# Patient Record
Sex: Male | Born: 1980 | Race: Asian | Hispanic: No | Marital: Married | Smoking: Never smoker
Health system: Southern US, Community
[De-identification: ages and names within clinical notes are randomized; demographics above are authoritative.]

---

## 2014-09-13 ENCOUNTER — Ambulatory Visit
Admission: RE | Admit: 2014-09-13 | Discharge: 2014-09-13 | Disposition: A | Discharge status: Home / Self Care | Payer: BLUE CROSS/BLUE SHIELD | Source: Ambulatory Visit | Attending: Family Medicine | Admitting: Family Medicine

## 2014-09-13 ENCOUNTER — Other Ambulatory Visit: Payer: Self-pay | Admitting: Family Medicine

## 2014-09-13 DIAGNOSIS — M545 Low back pain: Secondary | ICD-10-CM

## 2014-10-11 ENCOUNTER — Other Ambulatory Visit: Payer: Self-pay | Admitting: Family Medicine

## 2014-10-11 DIAGNOSIS — M545 Low back pain: Secondary | ICD-10-CM

## 2014-10-20 ENCOUNTER — Other Ambulatory Visit: Payer: BLUE CROSS/BLUE SHIELD

## 2014-11-07 ENCOUNTER — Ambulatory Visit (INDEPENDENT_AMBULATORY_CARE_PROVIDER_SITE_OTHER): Payer: BLUE CROSS/BLUE SHIELD | Admitting: Internal Medicine

## 2014-11-07 ENCOUNTER — Ambulatory Visit (INDEPENDENT_AMBULATORY_CARE_PROVIDER_SITE_OTHER): Payer: BLUE CROSS/BLUE SHIELD

## 2014-11-07 VITALS — BP 118/80 | HR 67 | Temp 98.3°F | Resp 16 | Ht 67.0 in | Wt 176.4 lb

## 2014-11-07 DIAGNOSIS — M5416 Radiculopathy, lumbar region: Secondary | ICD-10-CM

## 2014-11-07 DIAGNOSIS — M5417 Radiculopathy, lumbosacral region: Secondary | ICD-10-CM

## 2014-11-07 MED ORDER — CYCLOBENZAPRINE HCL 10 MG PO TABS: 10.0000 mg | ORAL_TABLET | Freq: Every day | ORAL | Status: DC

## 2014-11-07 MED ORDER — PREDNISONE 20 MG PO TABS: ORAL_TABLET | ORAL | Status: DC

## 2014-11-07 MED ORDER — DICLOFENAC SODIUM 75 MG PO TBEC: 75.0000 mg | DELAYED_RELEASE_TABLET | Freq: Two times a day (BID) | ORAL | Status: DC

## 2014-11-07 NOTE — Progress Notes (Signed)
Subjective:  This chart was scribed for Peter Pearsonobert P Dorothie Wah, MD by Charline BillsEssence Howell, ED Scribe. The patient was seen in room 7. Patient's care was started at 10:22 AM.   Patient ID: Peter Ward, male    DOB: 12/03/1980, 34 y.o.   MRN: [id number]  Chief Complaint  Patient presents with  . Back Pain    lower back pain with pain radiating to his left leg.  hard to lift left leg at times   HPI HPI Comments: Peter Ward is a 34 y.o. male who presents to the [facility] complaining of persistent lower back pain that radiates into L leg for the past 2 months. He reports that he was lifting a heavy object and twisting at work 2 months ago when he felt a sudden sharp pain in his back. Pain is exacerbated with lifting his L leg and his leg actually feels weak as he tries to step up. He denies increased pain with bending or rolling over at this time, but does get were with lifting at work on a daily basis. No past history of back injury Healthy otherwise  History reviewed. No pertinent past medical history. No current outpatient prescriptions on file prior to visit.   No current facility-administered medications on file prior to visit.   No Known Allergies  Med history in the computer shows treatment in February with meloxicam and low-dose prednisone He says this was done in an equal facility but as I search for records under care everywhere the [facility] response did not come and allow me access--x-rays reportedly were normal He did not respond at all to medication and has continued to have disability at work  Review of Systems  Musculoskeletal: Positive for back pain.   no constipation or diarrhea No stool incontinence No dysuria or frequency or urine incontinence Sleep is not disrupted by this pain    Objective:   Physical Exam  Constitutional: He is oriented to person, place, and time. He appears well-developed and well-nourished.  HENT:  Head: Normocephalic and atraumatic.    Cardiovascular: Normal rate.   Pulmonary/Chest: Effort normal.  Musculoskeletal:  Tender over the L SI area. Straight leg raise positive at 45 degrees on the L.   Neurological: He is alert and oriented to person, place, and time. He has normal strength and normal reflexes. No sensory deficit.  Skin: Skin is warm and dry.  Psychiatric: He has a normal mood and affect. His behavior is normal.  BP 118/80 mmHg  Pulse 67  Temp(Src) 98.3 F (36.8 C) (Oral)  Resp 16  Ht 5\' 7"  (1.702 m)  Wt 176 lb 6 oz (80.003 kg)  BMI 27.62 kg/m2  SpO2 99%  [facility] reading (PRIMARY) by  Dr. Merla Richesoolittle= there is some straightening of the spine but no obvious bony lesions or narrowing of disc spaces      Assessment & Plan:  Lumbar back pain with radiculopathy affecting left lower extremity - Plan: Work limitations 10-15 pounds, Ambulatory referral to Physical Therapy home stretch-legs up the wall Meds ordered this encounter  Medications  . predniSONE (DELTASONE) 20 MG tablet    Sig: 3/3/2/2/1/1 single daily dose for 6 days    Dispense:  12 tablet    Refill:  0  . cyclobenzaprine (FLEXERIL) 10 MG tablet    Sig: Take 1 tablet (10 mg total) by mouth at bedtime.    Dispense:  30 tablet    Refill:  0  . diclofenac (VOLTAREN) 75  MG EC tablet    Sig: Take 1 tablet (75 mg total) by mouth 2 (two) times daily.    Dispense:  60 tablet    Refill:  0   Follow-up one month-if he fails treatment we will MRI  I have completed the patient encounter in its entirety as documented by the scribe, with editing by me where necessary. Darryl Willner P. Merla Riches, M.D.

## 2014-11-13 ENCOUNTER — Telehealth: Payer: Self-pay

## 2014-11-13 NOTE — Telephone Encounter (Signed)
Pt states he would like to have a copy of his last visit so he can give to his employer. Please call [phone] when ready for pick up

## 2014-11-13 NOTE — Telephone Encounter (Signed)
Printed out the last office visit and put in pick up draw at 102 with ROI attached. Called patient and let him know it was ready for pick up and that he would need to fill out the ROI that would be attached to it, he stated that would be fine but that he would not be able to pick it up until Friday. Will check on ROI Friday.

## 2014-11-20 ENCOUNTER — Other Ambulatory Visit: Payer: Self-pay | Admitting: Radiology

## 2014-12-08 ENCOUNTER — Ambulatory Visit (INDEPENDENT_AMBULATORY_CARE_PROVIDER_SITE_OTHER): Payer: BLUE CROSS/BLUE SHIELD | Admitting: Internal Medicine

## 2014-12-08 VITALS — BP 120/66 | HR 89 | Temp 98.3°F | Resp 20 | Ht 67.5 in | Wt 180.5 lb

## 2014-12-08 DIAGNOSIS — M5416 Radiculopathy, lumbar region: Secondary | ICD-10-CM

## 2014-12-08 DIAGNOSIS — M5417 Radiculopathy, lumbosacral region: Secondary | ICD-10-CM

## 2014-12-08 NOTE — Progress Notes (Signed)
   Subjective:    Patient ID: Peter Ward, male    DOB: 02/17/1981, 34 y.o.   MRN: [id number] This chart was scribed for Ellamae Siaobert Trenae Brunke, MD by Littie Deedsichard Sun, Medical Scribe. This patient was seen in Room 1 and the patient's care was started at 1:31 PM.   HPI HPI Comments: Peter Ward is a 34 y.o. male who presents to the [facility] for a follow-up. Patient notes his back pain has improved significantly. He has been put on light duty, but states he feels he can return to work full duty and requests a note allowing him to return full duty.  He does report having a stabbing pain to his left foot occasionally. He does not think this is related to the back pain.  Review of Systems Noncontributory    Objective:   Physical Exam  Constitutional: He is oriented to person, place, and time. He appears well-developed and well-nourished. No distress.  HENT:  Head: Normocephalic and atraumatic.  Mouth/Throat: Oropharynx is clear and moist. No oropharyngeal exudate.  Eyes: Pupils are equal, round, and reactive to light.  Neck: Neck supple.  Cardiovascular: Normal rate.   Pulmonary/Chest: Effort normal.  Musculoskeletal: He exhibits no edema.  Lumbar range of motion motion full Straight leg raise negative to 90 bilaterally Deep tendon reflexes symmetrical Gait normal There is no tenderness to palpation in the left foot which also has a good range of motion without precipitating discomfort  Neurological: He is alert and oriented to person, place, and time. No cranial nerve deficit.  Skin: Skin is warm and dry. No rash noted.  Psychiatric: He has a normal mood and affect. His behavior is normal.  Vitals reviewed.         Assessment & Plan:   Lumbar back pain with radiculopathy affecting left lower extremity resolved  I have completed the patient encounter in its entirety as documented by the scribe, with editing by me where necessary. Vikas Wegmann P. Merla Richesoolittle, M.D.

## 2016-09-19 DIAGNOSIS — E785 Hyperlipidemia, unspecified: Secondary | ICD-10-CM | POA: Diagnosis not present

## 2016-09-19 DIAGNOSIS — K640 First degree hemorrhoids: Secondary | ICD-10-CM | POA: Diagnosis not present

## 2016-09-19 DIAGNOSIS — Z0000 Encounter for general adult medical examination without abnormal findings: Secondary | ICD-10-CM | POA: Diagnosis not present

## 2016-09-19 DIAGNOSIS — J029 Acute pharyngitis, unspecified: Secondary | ICD-10-CM | POA: Diagnosis not present

## 2016-11-28 DIAGNOSIS — H8113 Benign paroxysmal vertigo, bilateral: Secondary | ICD-10-CM | POA: Diagnosis not present

## 2016-11-28 DIAGNOSIS — R42 Dizziness and giddiness: Secondary | ICD-10-CM | POA: Diagnosis not present

## 2018-02-28 ENCOUNTER — Emergency Department (HOSPITAL_COMMUNITY): Payer: BLUE CROSS/BLUE SHIELD

## 2018-02-28 ENCOUNTER — Encounter (HOSPITAL_COMMUNITY): Payer: Self-pay | Admitting: Emergency Medicine

## 2018-02-28 ENCOUNTER — Other Ambulatory Visit: Payer: Self-pay

## 2018-02-28 ENCOUNTER — Emergency Department (HOSPITAL_COMMUNITY)
Admission: EM | Admit: 2018-02-28 | Discharge: 2018-02-28 | Disposition: A | Discharge status: Home / Self Care | Payer: BLUE CROSS/BLUE SHIELD | Attending: Emergency Medicine | Admitting: Emergency Medicine

## 2018-02-28 DIAGNOSIS — M25512 Pain in left shoulder: Secondary | ICD-10-CM

## 2018-02-28 DIAGNOSIS — Z79899 Other long term (current) drug therapy: Secondary | ICD-10-CM | POA: Diagnosis not present

## 2018-02-28 DIAGNOSIS — R0789 Other chest pain: Secondary | ICD-10-CM | POA: Insufficient documentation

## 2018-02-28 DIAGNOSIS — R079 Chest pain, unspecified: Secondary | ICD-10-CM | POA: Diagnosis not present

## 2018-02-28 LAB — CBC
HCT: 43.1 % (ref 39.0–52.0)
Hemoglobin: 14.2 g/dL (ref 13.0–17.0)
MCH: 31.1 pg (ref 26.0–34.0)
MCHC: 32.9 g/dL (ref 30.0–36.0)
MCV: 94.3 fL (ref 78.0–100.0)
PLATELETS: 217 10*3/uL (ref 150–400)
RBC: 4.57 MIL/uL (ref 4.22–5.81)
RDW: 11.8 % (ref 11.5–15.5)
WBC: 7 10*3/uL (ref 4.0–10.5)

## 2018-02-28 LAB — BASIC METABOLIC PANEL
Anion gap: 11 (ref 5–15)
BUN: 14 mg/dL (ref 6–20)
CO2: 24 mmol/L (ref 22–32)
CREATININE: 1.09 mg/dL (ref 0.61–1.24)
Calcium: 9.6 mg/dL (ref 8.9–10.3)
Chloride: 104 mmol/L (ref 98–111)
GFR calc Af Amer: >60 mL/min (ref >60)
GFR calc non Af Amer: >60 mL/min (ref >60)
Glucose, Bld: 111 mg/dL — ABNORMAL HIGH (ref 70–99)
Potassium: 3.8 mmol/L (ref 3.5–5.1)
Sodium: 139 mmol/L (ref 135–145)

## 2018-02-28 LAB — I-STAT TROPONIN, ED: TROPONIN I, POC: 0 ng/mL (ref 0.00–0.08)

## 2018-02-28 MED ORDER — METHOCARBAMOL 500 MG PO TABS
500.0000 mg | ORAL_TABLET | Freq: Two times a day (BID) | ORAL | 0 refills | Status: DC
Start: 2018-02-28 — End: 2023-06-21

## 2018-02-28 NOTE — ED Provider Notes (Signed)
[facility] EMERGENCY DEPARTMENT Provider Note   CSN: [id number] Arrival date & time: 02/28/18  0118     History   Chief Complaint Chief Complaint  Patient presents with  . Chest Pain    HPI Peter Ward is a 37 y.o. male.  HPI   Peter Ward is a 37 y.o. male, patient with no pertinent past medical history, presenting to the ED with left-sided chest pain began a few days ago.  Pain is intermittent, arises with movement of the left arm or twisting of the body, last for about a minute, sharp, 6/10, accompanied by tingling in the left arm and some pain in the left shoulder.  Has not tried any therapies for this issue.  Denies fever/chills, cough, shortness of breath, N/V/D, numbness, weakness, trauma, abdominal pain, lower extremity edema or pain, or any other complaints.      History reviewed. No pertinent past medical history.  There are no active problems to display for this patient.   History reviewed. No pertinent surgical history.      Home Medications    Prior to Admission medications   Medication Sig Start Date End Date Taking? Authorizing Provider  Omega-3 Fatty Acids (FISH OIL PO) Take 1 capsule by mouth daily.   Yes [provider]  cyclobenzaprine (FLEXERIL) 10 MG tablet Take 1 tablet (10 mg total) by mouth at bedtime. Patient not taking: Reported on 02/28/2018 11/07/14   Peter Pearson, MD  diclofenac (VOLTAREN) 75 MG EC tablet Take 1 tablet (75 mg total) by mouth 2 (two) times daily. Patient not taking: Reported on 02/28/2018 11/07/14   Peter Pearson, MD  methocarbamol (ROBAXIN) 500 MG tablet Take 1 tablet (500 mg total) by mouth 2 (two) times daily. 02/28/18   Peter Ward, Peter Danker, PA-C  predniSONE (DELTASONE) 20 MG tablet 3/3/2/2/1/1 single daily dose for 6 days Patient not taking: Reported on 12/08/2014 11/07/14   Peter Pearson, MD    Family History Family History  Problem Relation Age of Onset  . Diabetes Mother     Social  History Social History   Tobacco Use  . Smoking status: Never Smoker  . Smokeless tobacco: Never Used  Substance Use Topics  . Alcohol use: No    Alcohol/week: 0.0 oz  . Drug use: No     Allergies   Patient has no known allergies.   Review of Systems Review of Systems  Constitutional: Negative for chills, diaphoresis and fever.  Respiratory: Negative for cough and shortness of breath.   Cardiovascular: Positive for chest pain. Negative for leg swelling.  Gastrointestinal: Negative for abdominal pain, diarrhea, nausea and vomiting.  Musculoskeletal: Positive for arthralgias. Negative for back pain and neck pain.  Neurological: Negative for weakness and numbness.  All other systems reviewed and are negative.    Physical Exam Updated Vital Signs BP (!) 145/94 (BP Location: Right Arm)   Pulse (!) 55   Temp 97.8 F (36.6 C) (Oral)   Resp 18   Ht 5\' 7"  (1.702 m)   Wt 83.5 kg (184 lb)   SpO2 99%   BMI 28.82 kg/m   Physical Exam  Constitutional: He appears well-developed and well-nourished. No distress.  HENT:  Head: Normocephalic and atraumatic.  Eyes: Conjunctivae are normal.  Neck: Neck supple.  Cardiovascular: Normal rate, regular rhythm, normal heart sounds and intact distal pulses.  Pulmonary/Chest: Effort normal and breath sounds normal. No respiratory distress.    Abdominal: Soft. There is no tenderness. There  is no guarding.  Musculoskeletal: He exhibits no edema.       Arms: Full range of motion in the left shoulder without hesitation or pain.  No noted swelling, erythema, bruising, instability, deformity, or crepitus in the left shoulder or the chest.  Lymphadenopathy:    He has no cervical adenopathy.  Neurological: He is alert.  Sensation grossly intact to light touch through each of the nerve distributions of the bilateral upper extremities. Abduction and adduction of the fingers intact against resistance. Grip strength equal bilaterally. Supination  and pronation intact against resistance. Strength 5/5 through the cardinal directions of the bilateral wrists. Strength 5/5 with flexion and extension of the bilateral elbows. Strength 5/5 in the cardinal directions of the bilateral shoulders. Patient can touch the thumb to each one of the fingertips without difficulty.   Skin: Skin is warm and dry. He is not diaphoretic.  Psychiatric: He has a normal mood and affect. His behavior is normal.  Nursing note and vitals reviewed.    ED Treatments / Results  Labs (all labs ordered are listed, but only abnormal results are displayed) Labs Reviewed  BASIC METABOLIC PANEL - Abnormal; Notable for the following components:      Result Value   Glucose, Bld 111 (*)    All other components within normal limits  CBC  I-STAT TROPONIN, ED    EKG EKG Interpretation  Date/Time:  Sunday February 28 2018 01:21:59 EDT Ventricular Rate:  56 PR Interval:  182 QRS Duration: 104 QT Interval:  416 QTC Calculation: 401 R Axis:   80 Text Interpretation:  Sinus bradycardia Otherwise normal ECG No old tracing to compare Confirmed by Jacalyn Lefevre [phone]) on 02/28/2018 7:11:30 AM   Radiology Dg Chest 2 View  Result Date: 02/28/2018 CLINICAL DATA:  Left-sided chest pain. EXAM: CHEST - 2 VIEW COMPARISON:  None. FINDINGS: The cardiomediastinal contours are normal. The lungs are clear. Pulmonary vasculature is normal. No consolidation, pleural effusion, or pneumothorax. No acute osseous abnormalities are seen. IMPRESSION: Normal radiographs of the chest. Electronically Signed   By: Peter Ward M.D.   On: 02/28/2018 02:12    Procedures Procedures (including critical care time)  Medications Ordered in ED Medications - No data to display   Initial Impression / Assessment and Plan / ED Course  I have reviewed the triage vital signs and the nursing notes.  Pertinent labs & imaging results that were available during my care of the patient were reviewed  by me and considered in my medical decision making (see chart for details).     Patient presents with complaint of intermittent chest pain along with tingling in the left arm. Low suspicion for ACS. HEART score is 1, indicating low risk for a cardiac event. Wells criteria score is 0, indicating low risk for PE. PERC negative.  Labs, EKG, and chest x-ray results reassuring.  PCP vs orthopedic follow-up. The patient was given instructions for home care as well as return precautions. Patient voices understanding of these instructions, accepts the plan, and is comfortable with discharge.  Vitals:   02/28/18 0120 02/28/18 0400  BP: (!) 144/99 (!) 145/94  Pulse: (!) 52 (!) 55  Resp: 16 18  Temp: 97.8 F (36.6 C)   TempSrc: Oral   SpO2: 96% 99%  Weight: 83.5 kg (184 lb)   Height: 5\' 7"  (1.702 m)      Final Clinical Impressions(s) / ED Diagnoses   Final diagnoses:  Atypical chest pain  Acute pain of left  shoulder    ED Discharge Orders        Ordered    methocarbamol (ROBAXIN) 500 MG tablet  2 times daily     02/28/18 0721       Anselm PancoastJoy, Gregoire Bennis C, PA-C 02/28/18 [id number]    Jacalyn LefevreHaviland, Julie, MD 02/28/18 [phone]

## 2018-02-28 NOTE — ED Notes (Signed)
Pt discharged from ED; instructions provided; Pt encouraged to return to ED if symptoms worsen and to f/u with PCP; Pt verbalized understanding of all instructions 

## 2018-02-28 NOTE — ED Triage Notes (Signed)
Pt states for a few weeks has had left sided chest pain that occasionally moves to his shoulder. No n/v or diaphoresis. Has had increase in burping. Worse with movement.

## 2018-02-28 NOTE — Discharge Instructions (Signed)
Your labs, imaging, and EKG results were reassuring.   Antiinflammatory medications: Take 600 mg of ibuprofen every 6 hours or 440 mg (over the counter dose) to 500 mg (prescription dose) of naproxen every 12 hours for the next 3 days. After this time, these medications may be used as needed for pain. Take these medications with food to avoid upset stomach. Choose only one of these medications, do not take them together.  Tylenol: Should you continue to have additional pain while taking the ibuprofen or naproxen, you may add in tylenol as needed. Your daily total maximum amount of tylenol from all sources should be limited to 4000mg /day for persons without liver problems, or 2000mg /day for those with liver problems. Muscle relaxer: Robaxin is a muscle relaxer and may help loosen stiff muscles. Do not take the Robaxin while driving or performing other dangerous activities.  Exercises: Be sure to perform the attached exercises starting with three times a week and working up to performing them daily. This is an essential part of preventing long term problems.  Follow up: Follow up with a primary care provider for any future management of these complaints. Be sure to follow up within 7-10 days.  May also follow-up with the orthopedic specialist on this matter. Return: Return to the ED should symptoms worsen or you have persistent shortness of breath, dizziness, sweating, extremity weakness, or any other major concerns.

## 2018-03-05 DIAGNOSIS — R0789 Other chest pain: Secondary | ICD-10-CM | POA: Diagnosis not present

## 2018-03-05 DIAGNOSIS — H00011 Hordeolum externum right upper eyelid: Secondary | ICD-10-CM | POA: Diagnosis not present

## 2018-04-06 DIAGNOSIS — Z0000 Encounter for general adult medical examination without abnormal findings: Secondary | ICD-10-CM | POA: Diagnosis not present

## 2018-04-06 DIAGNOSIS — K640 First degree hemorrhoids: Secondary | ICD-10-CM | POA: Diagnosis not present

## 2018-04-06 DIAGNOSIS — E785 Hyperlipidemia, unspecified: Secondary | ICD-10-CM | POA: Diagnosis not present

## 2019-09-29 ENCOUNTER — Other Ambulatory Visit: Payer: Self-pay | Admitting: Gastroenterology

## 2019-09-29 DIAGNOSIS — R109 Unspecified abdominal pain: Secondary | ICD-10-CM

## 2019-10-05 ENCOUNTER — Other Ambulatory Visit: Payer: Self-pay | Admitting: Gastroenterology

## 2019-10-05 DIAGNOSIS — R1013 Epigastric pain: Secondary | ICD-10-CM

## 2019-10-06 ENCOUNTER — Ambulatory Visit
Admission: RE | Admit: 2019-10-06 | Discharge: 2019-10-06 | Disposition: A | Discharge status: Home / Self Care | Payer: BLUE CROSS/BLUE SHIELD | Source: Ambulatory Visit | Attending: Gastroenterology | Admitting: Gastroenterology

## 2019-10-06 DIAGNOSIS — R1013 Epigastric pain: Secondary | ICD-10-CM

## 2020-05-30 IMAGING — CR DG CHEST 2V
2 series · 2 of 2 positions shown · non-contrast
Comparison: None.

CLINICAL DATA: Left-sided chest pain.

EXAM:
CHEST - 2 VIEW

[chest pa]
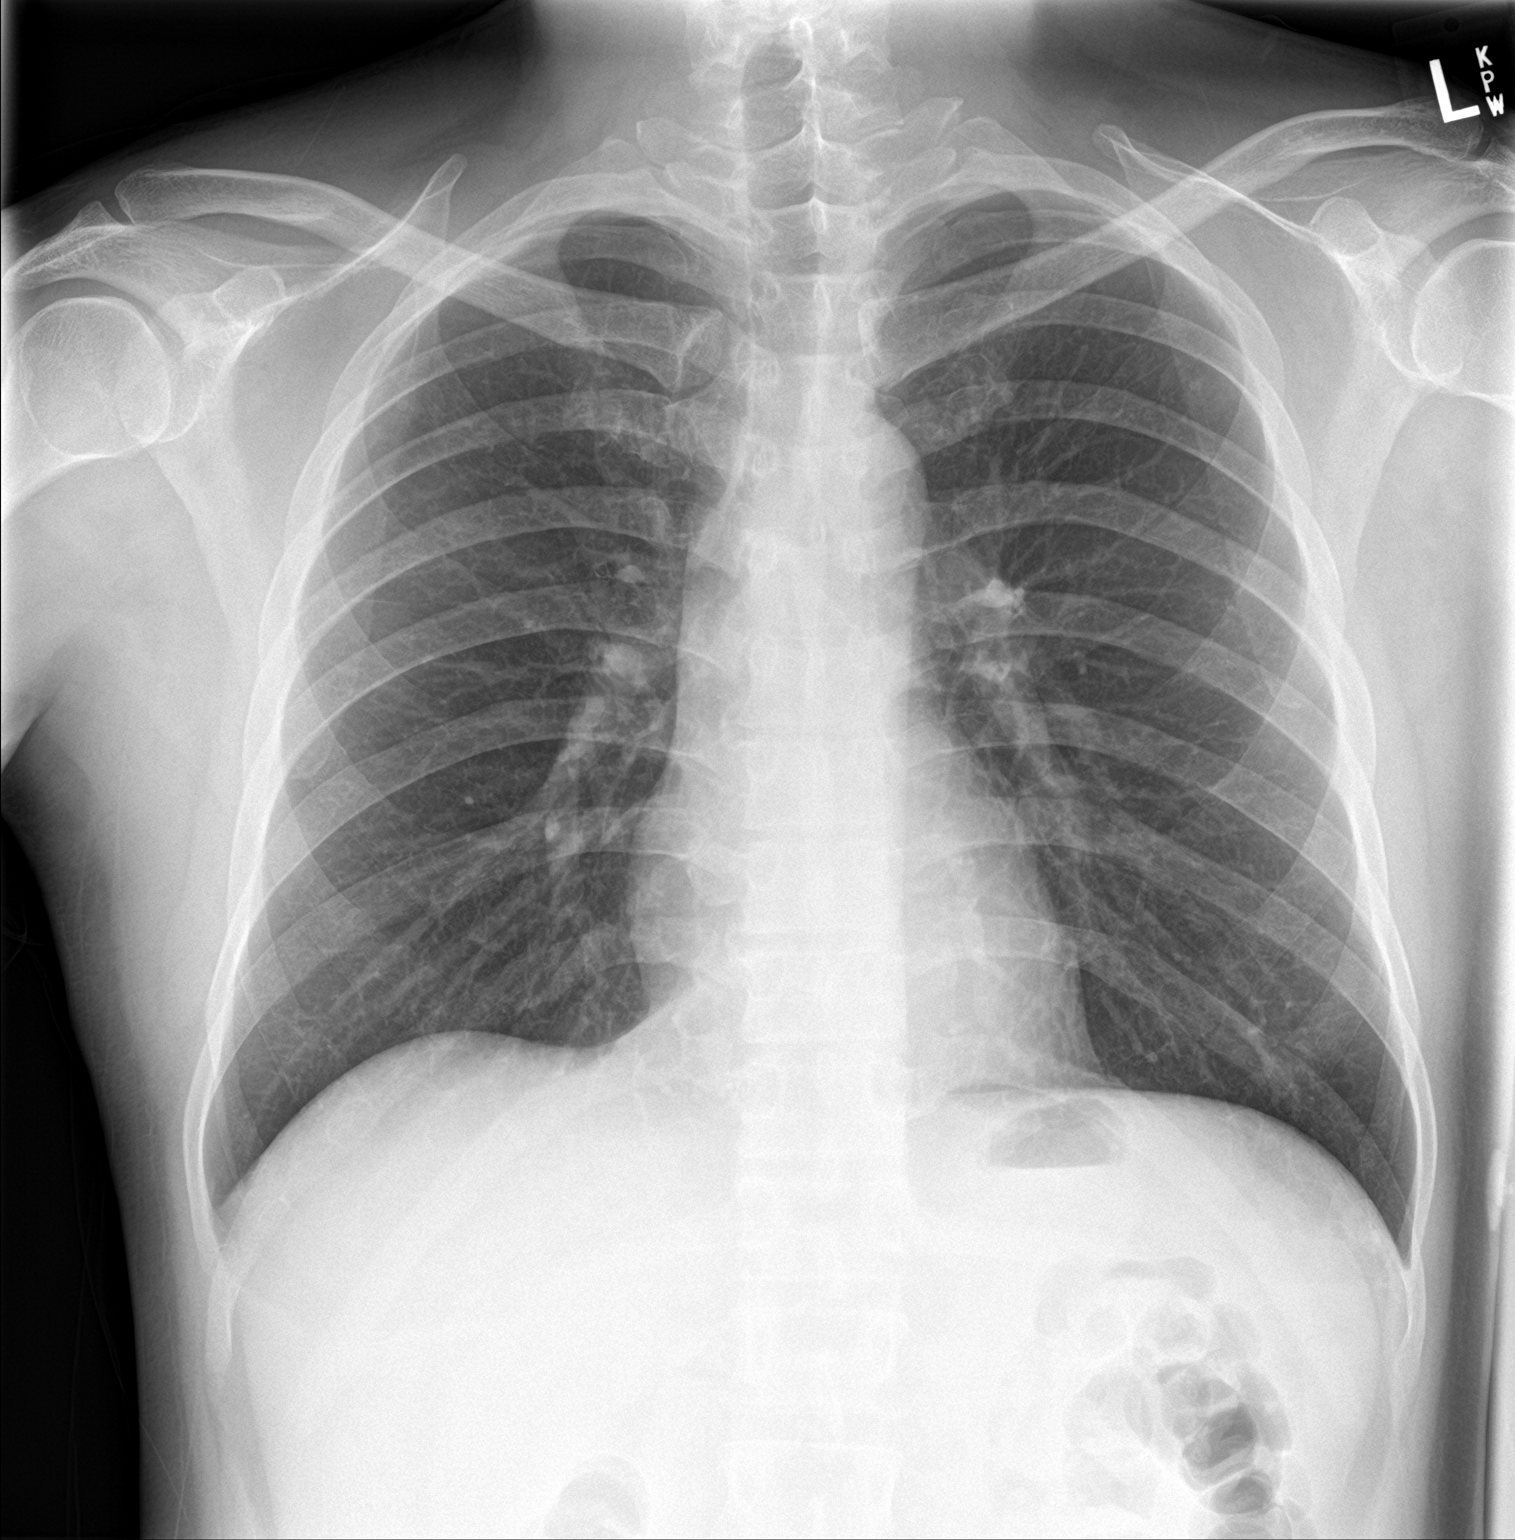

[chest lat]
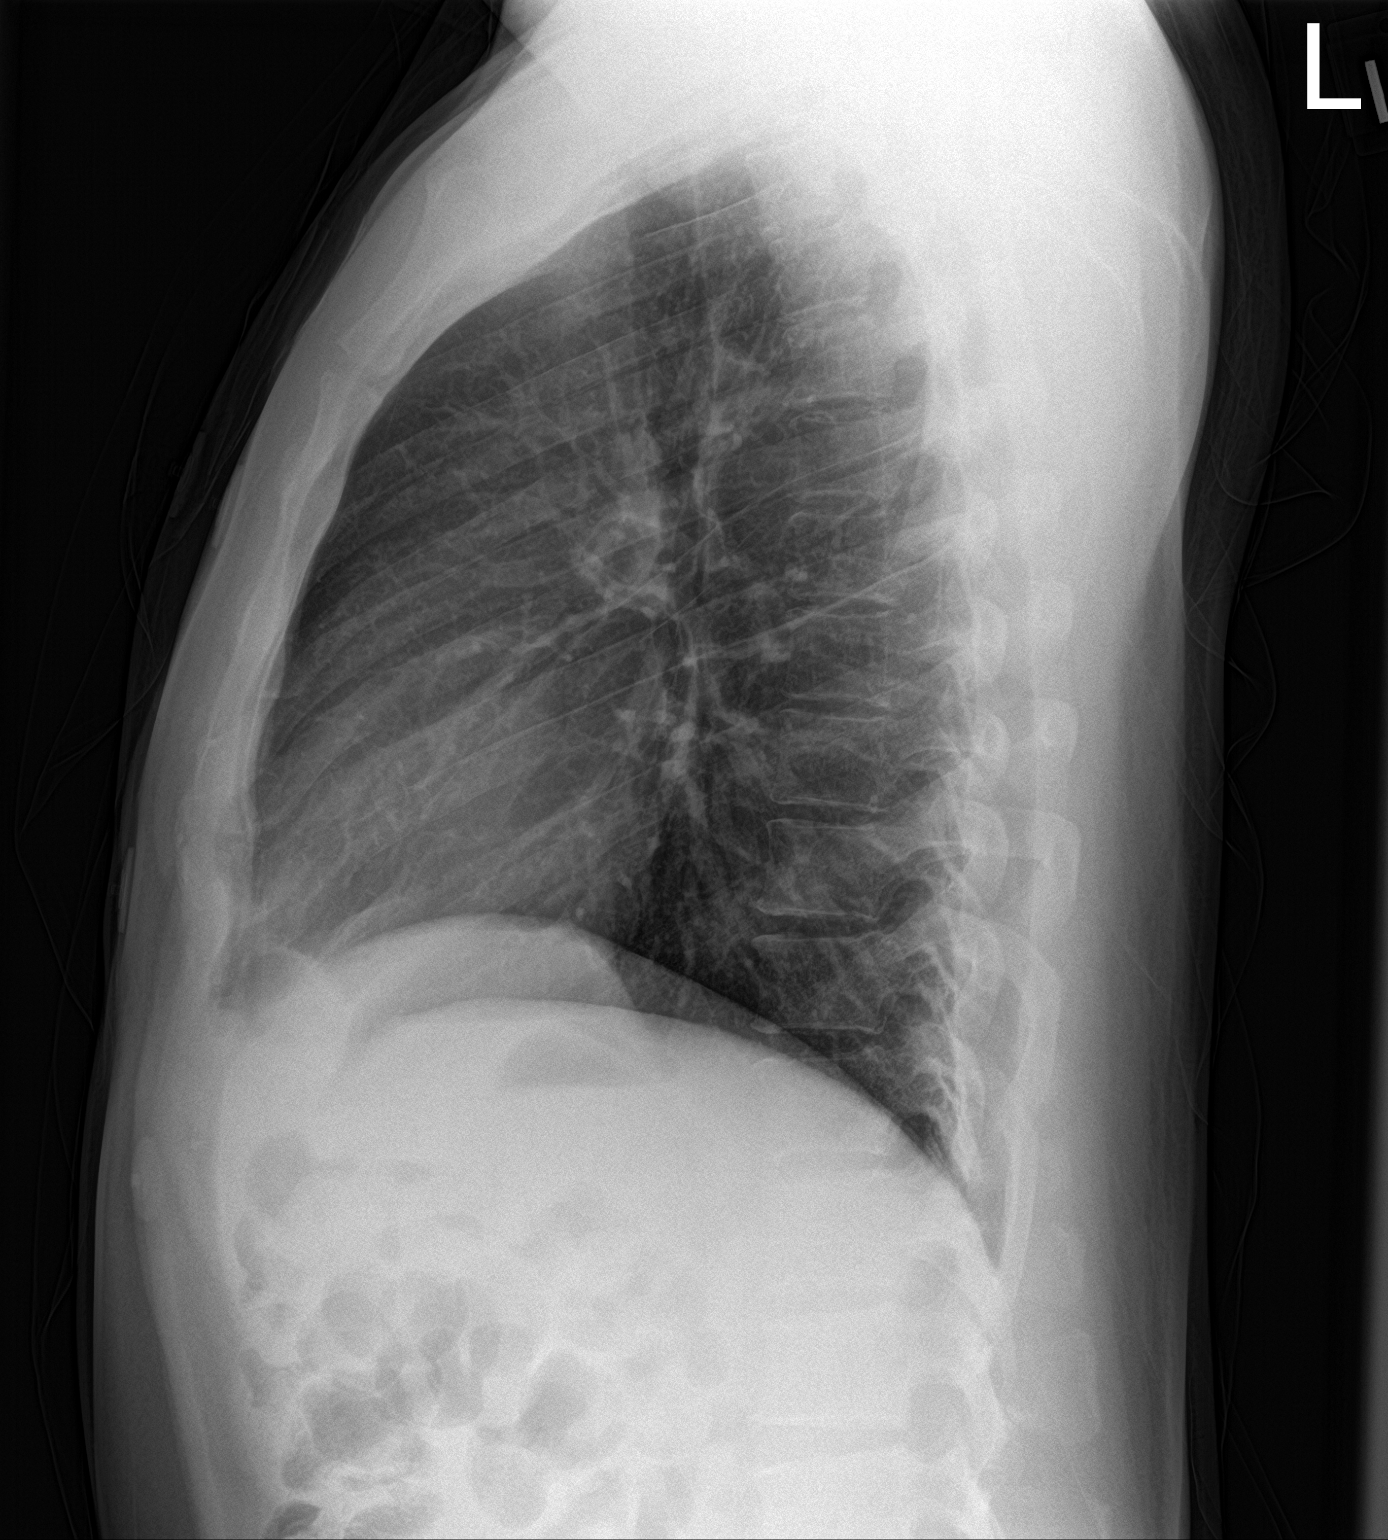

[2 of 2 positions shown; findings below may reference images not displayed]

FINDINGS: The cardiomediastinal contours are normal. The lungs are clear.
Pulmonary vasculature is normal. No consolidation, pleural effusion,
or pneumothorax. No acute osseous abnormalities are seen.
IMPRESSION: Normal radiographs of the chest.

## 2022-01-05 IMAGING — US US ABDOMEN COMPLETE
1 series · 14 of 25 positions shown · non-contrast
Comparison: None.

CLINICAL DATA: Epigastric burning sensation

EXAM:
ABDOMEN ULTRASOUND COMPLETE

[Series 1: us abdomen complete · 0.14mm/px · 14 of 179 slices shown]
[im 1/179]
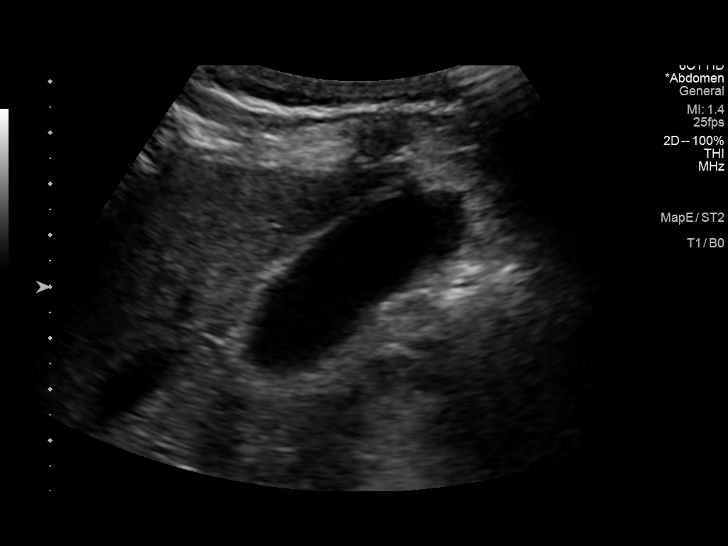
[im 15/179]
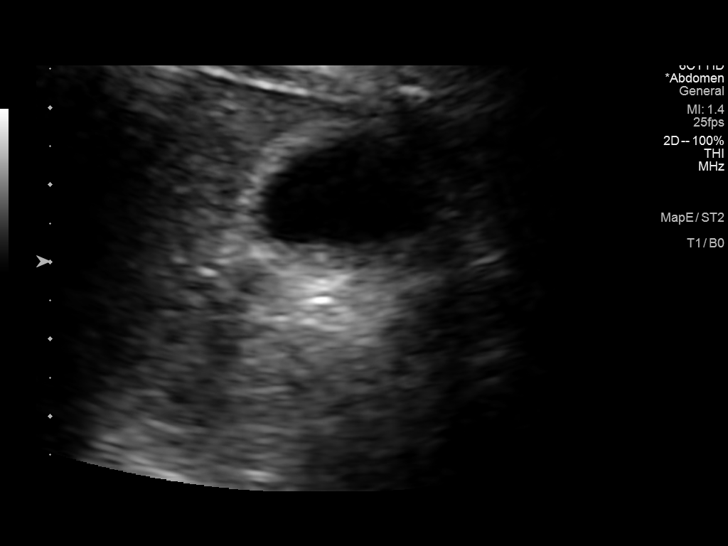
[im 30/179]
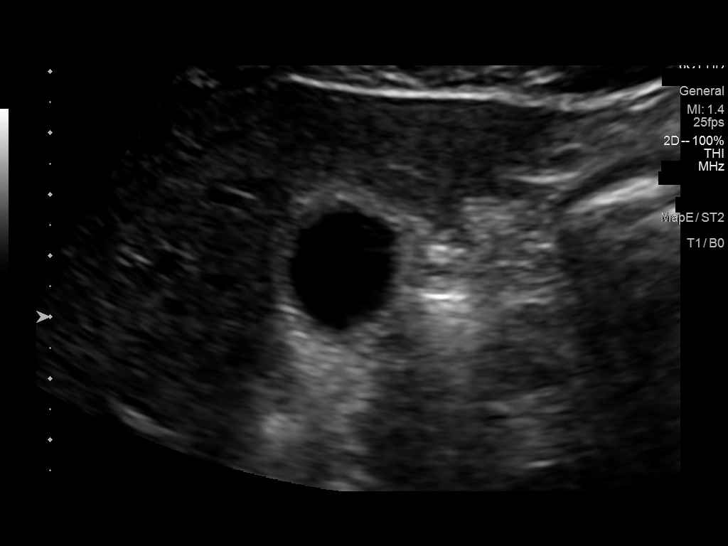
[im 45/179]
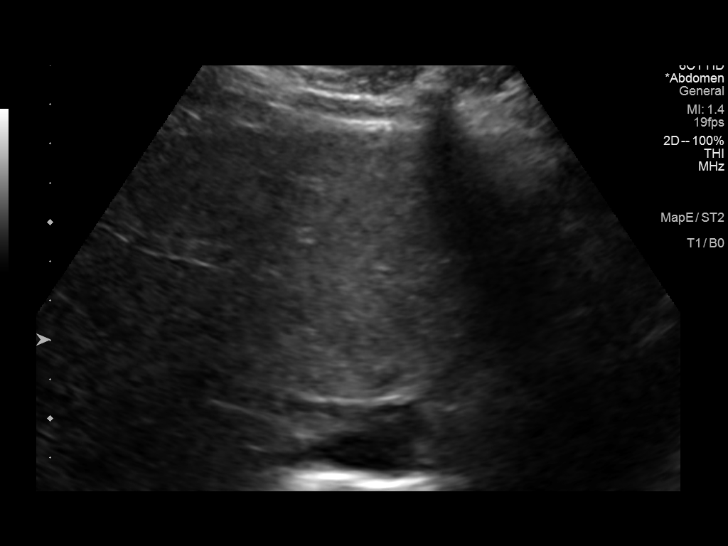
[im 60/179]
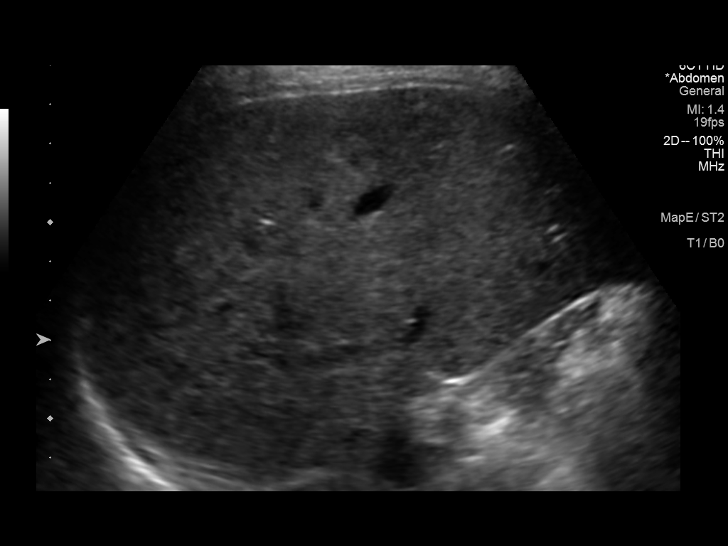
[im 67/179]
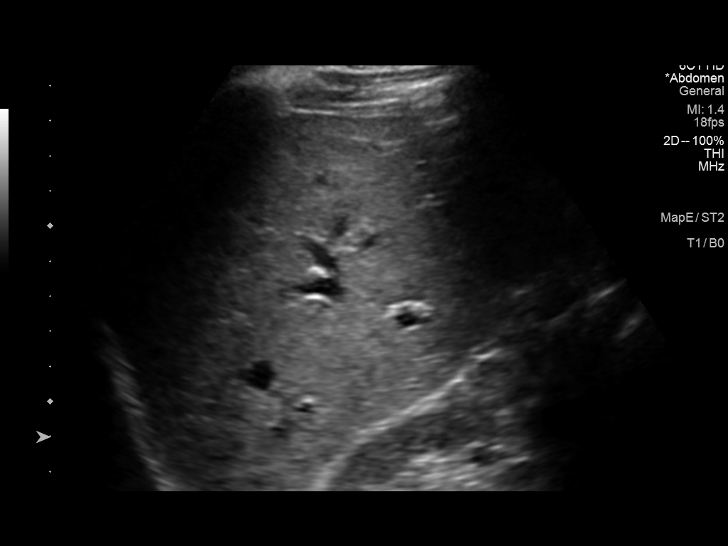
[im 82/179]
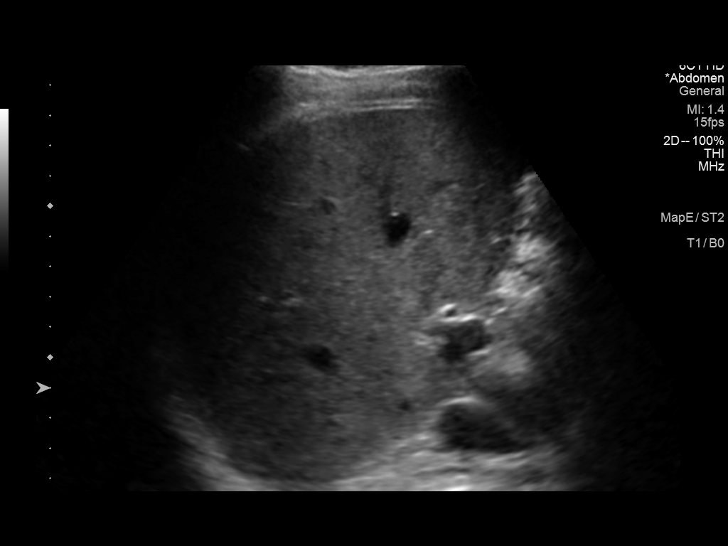
[im 97/179]
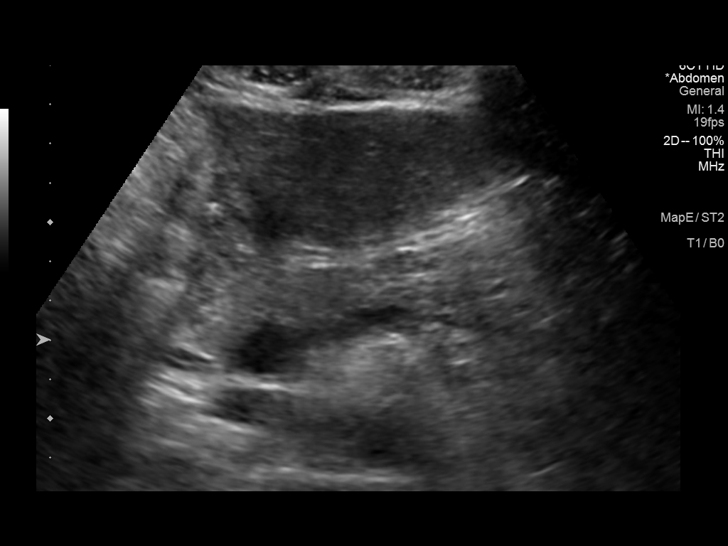
[im 112/179]
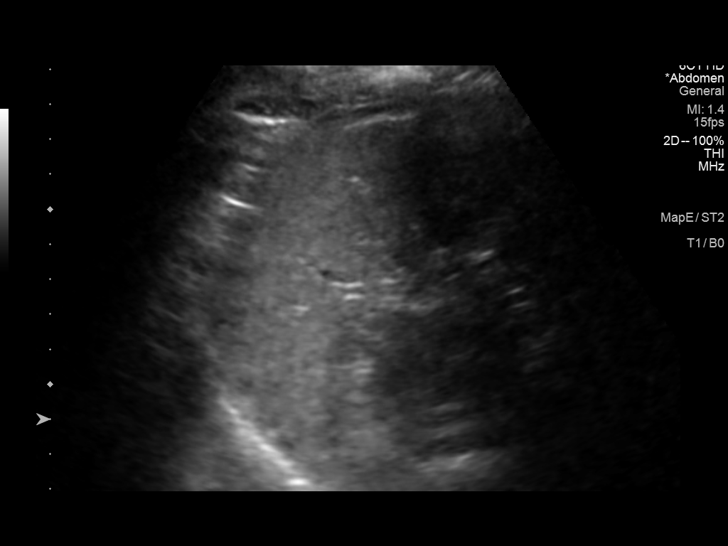
[im 119/179]
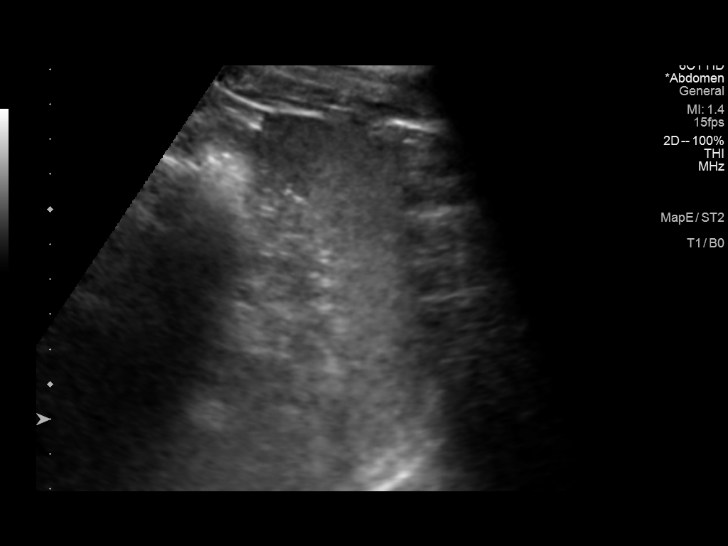
[im 134/179]
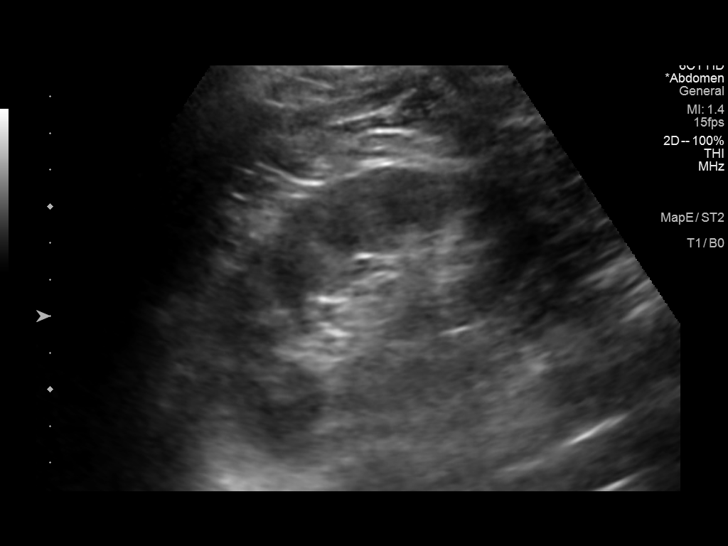
[im 149/179]
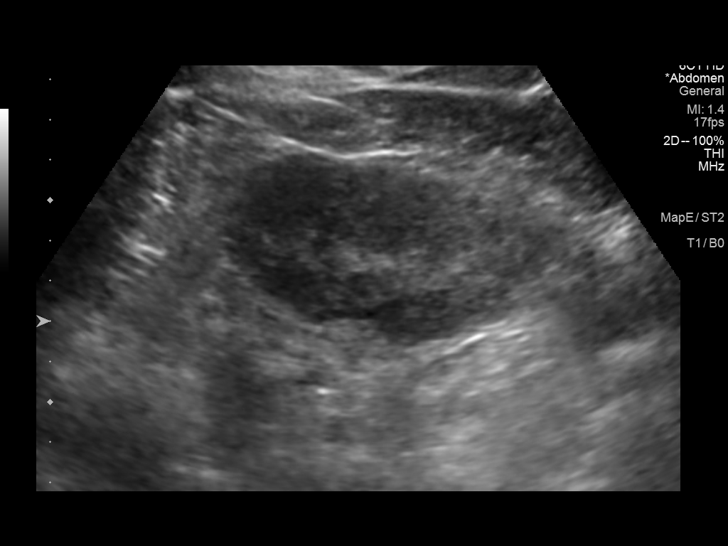
[im 164/179]
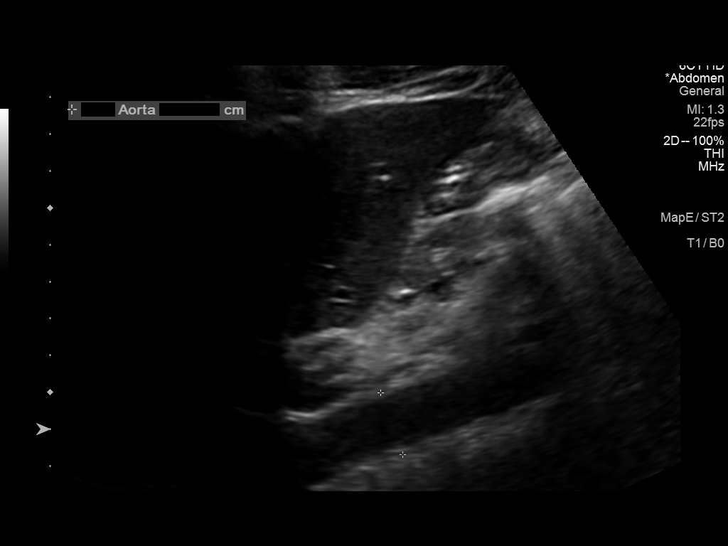
[im 179/179]
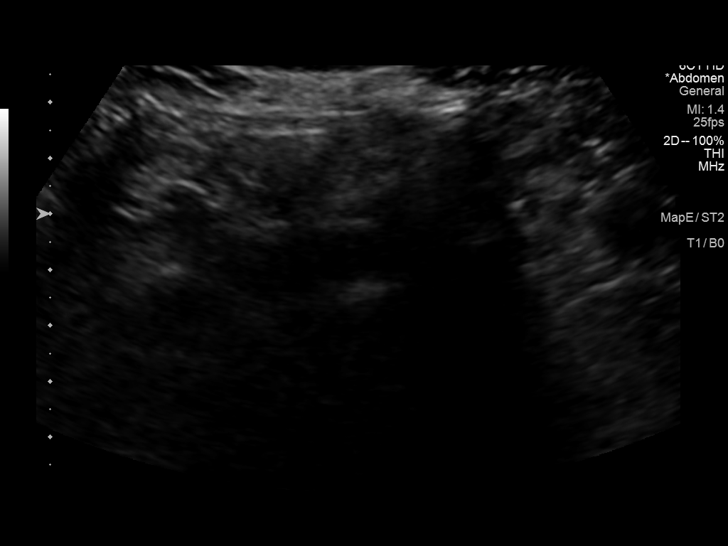

[14 of 25 positions shown; findings below may reference images not displayed]

FINDINGS: Gallbladder: No gallstones or wall thickening visualized. No
sonographic Murphy sign noted by sonographer.

Common bile duct: Diameter: 2 mm

Liver: No focal lesion identified. Within normal limits in
parenchymal echogenicity. Portal vein is patent on color Doppler
imaging with normal direction of blood flow towards the liver.

IVC: No abnormality visualized.

Pancreas: Visualized portion unremarkable.

Spleen: Size and appearance within normal limits.

Right Kidney: Length: 10.2 cm. Echogenicity within normal limits. No
mass or hydronephrosis visualized.

Left Kidney: Length: 11 cm. Echogenicity within normal limits. No
mass or hydronephrosis visualized.

Abdominal aorta: No aneurysm visualized.

Other findings: None.
IMPRESSION: No significant abnormality.

## 2023-06-21 ENCOUNTER — Encounter (HOSPITAL_COMMUNITY): Payer: Self-pay

## 2023-06-21 ENCOUNTER — Emergency Department (HOSPITAL_COMMUNITY): Payer: 59

## 2023-06-21 ENCOUNTER — Emergency Department (HOSPITAL_COMMUNITY)
Admission: EM | Admit: 2023-06-21 | Discharge: 2023-06-21 | Disposition: A | Discharge status: Home / Self Care | Payer: 59 | Attending: Emergency Medicine | Admitting: Emergency Medicine

## 2023-06-21 DIAGNOSIS — R1031 Right lower quadrant pain: Secondary | ICD-10-CM | POA: Diagnosis present

## 2023-06-21 DIAGNOSIS — R1012 Left upper quadrant pain: Secondary | ICD-10-CM | POA: Diagnosis not present

## 2023-06-21 DIAGNOSIS — K921 Melena: Secondary | ICD-10-CM | POA: Insufficient documentation

## 2023-06-21 DIAGNOSIS — R109 Unspecified abdominal pain: Secondary | ICD-10-CM

## 2023-06-21 LAB — COMPREHENSIVE METABOLIC PANEL
ALT: 24 U/L (ref 0–44)
AST: 21 U/L (ref 15–41)
Albumin: 4.7 g/dL (ref 3.5–5.0)
Alkaline Phosphatase: 66 U/L (ref 38–126)
Anion gap: 9 (ref 5–15)
BUN: 21 mg/dL — ABNORMAL HIGH (ref 6–20)
CO2: 25 mmol/L (ref 22–32)
Calcium: 9.8 mg/dL (ref 8.9–10.3)
Chloride: 105 mmol/L (ref 98–111)
Creatinine, Ser: 0.93 mg/dL (ref 0.61–1.24)
GFR, Estimated: >60 mL/min (ref >60)
Glucose, Bld: 116 mg/dL — ABNORMAL HIGH (ref 70–99)
Potassium: 4.1 mmol/L (ref 3.5–5.1)
Sodium: 139 mmol/L (ref 135–145)
Total Bilirubin: 0.7 mg/dL (ref <1.2)
Total Protein: 8 g/dL (ref 6.5–8.1)

## 2023-06-21 LAB — TYPE AND SCREEN
ABO/RH(D): O POS
Antibody Screen: NEGATIVE

## 2023-06-21 LAB — CBC
HCT: 40.8 % (ref 39.0–52.0)
Hemoglobin: 13.4 g/dL (ref 13.0–17.0)
MCH: 31.4 pg (ref 26.0–34.0)
MCHC: 32.8 g/dL (ref 30.0–36.0)
MCV: 95.6 fL (ref 80.0–100.0)
Platelets: 239 10*3/uL (ref 150–400)
RBC: 4.27 MIL/uL (ref 4.22–5.81)
RDW: 11.7 % (ref 11.5–15.5)
WBC: 6.6 10*3/uL (ref 4.0–10.5)
nRBC: 0 % (ref 0.0–0.2)

## 2023-06-21 LAB — LIPASE, BLOOD: Lipase: 39 U/L (ref 11–51)

## 2023-06-21 LAB — URINALYSIS, ROUTINE W REFLEX MICROSCOPIC
Bilirubin Urine: NEGATIVE
Glucose, UA: NEGATIVE mg/dL
Hgb urine dipstick: NEGATIVE
Ketones, ur: NEGATIVE mg/dL
Leukocytes,Ua: NEGATIVE
Nitrite: NEGATIVE
Protein, ur: NEGATIVE mg/dL
Specific Gravity, Urine: 1.015 (ref 1.005–1.030)
pH: 5 (ref 5.0–8.0)

## 2023-06-21 LAB — ABO/RH: ABO/RH(D): O POS

## 2023-06-21 MED ORDER — IOHEXOL 300 MG/ML  SOLN
100.0000 mL | Freq: Once | INTRAMUSCULAR | Status: AC | PRN
  Administered 2023-06-21: 100 mL via INTRAVENOUS

## 2023-06-21 MED ORDER — DICYCLOMINE HCL 20 MG PO TABS: 20.0000 mg | ORAL_TABLET | Freq: Two times a day (BID) | ORAL | 0 refills | Status: DC

## 2023-06-21 NOTE — ED Provider Notes (Signed)
[place] [facility]MENT AT [facility] Provider Note   CSN: [id number] Arrival date & time: 06/21/23  1100     History  Chief Complaint  Patient presents with   Abdominal Pain   Rectal Bleeding    [facility] is a 42 y.o. male.  Patient with no past surgical history of the abdomen, history of hemorrhoids banded by general surgery, history of negative abdominal ultrasound 3 years ago --presents to the emergency department for evaluation of left-sided abdominal pain ongoing over the past 3 or 4 days.  Patient has had some red blood noted with bowel movements.  He feels that this is different than his typical hemorrhoidal bleeding.  No rectal pain.  No history of colitis or diverticulitis.  He was seen at outside urgent care and sent for further evaluation and possible imaging.  Patient denies fevers, chest pain or shortness of breath.  No urinary symptoms.         Home Medications Prior to Admission medications   Medication Sig Start Date End Date Taking? Authorizing Provider  cyclobenzaprine (FLEXERIL) 10 MG tablet Take 1 tablet (10 mg total) by mouth at bedtime. Patient not taking: Reported on 02/28/2018 11/07/14   Tonye Pearson, MD  diclofenac (VOLTAREN) 75 MG EC tablet Take 1 tablet (75 mg total) by mouth 2 (two) times daily. Patient not taking: Reported on 02/28/2018 11/07/14   Tonye Pearson, MD  methocarbamol (ROBAXIN) 500 MG tablet Take 1 tablet (500 mg total) by mouth 2 (two) times daily. 02/28/18   Joy, Shawn C, PA-C  Omega-3 Fatty Acids (FISH OIL PO) Take 1 capsule by mouth daily.    [provider]  predniSONE (DELTASONE) 20 MG tablet 3/3/2/2/1/1 single daily dose for 6 days Patient not taking: Reported on 12/08/2014 11/07/14   Tonye Pearson, MD      Allergies    Patient has no known allergies.    Review of Systems   Review of Systems  Physical Exam Updated Vital Signs BP (!) 145/101 (BP Location: Left Arm)   Pulse 69   Temp  98.6 F (37 C) (Oral)   Resp 16   Ht 5\' 7"  (1.702 m)   Wt 81.6 kg   SpO2 97%   BMI 28.19 kg/m  Physical Exam Vitals and nursing note reviewed. Exam conducted with a chaperone present.  Constitutional:      General: He is not in acute distress.    Appearance: He is well-developed.  HENT:     Head: Normocephalic and atraumatic.  Eyes:     General:        Right eye: No discharge.        Left eye: No discharge.     Conjunctiva/sclera: Conjunctivae normal.  Cardiovascular:     Rate and Rhythm: Normal rate and regular rhythm.     Heart sounds: Normal heart sounds.  Pulmonary:     Effort: Pulmonary effort is normal.     Breath sounds: Normal breath sounds.  Abdominal:     Palpations: Abdomen is soft.     Tenderness: There is abdominal tenderness in the left upper quadrant and left lower quadrant. There is no guarding or rebound.  Genitourinary:    Comments: Small nonthrombosed external hemorrhoid noted.  No signs of perirectal abscess.  No active bleeding visualized. Musculoskeletal:     Cervical back: Normal range of motion and neck supple.  Skin:    General: Skin is warm and dry.  Neurological:  Mental Status: He is alert.     ED Results / Procedures / Treatments   Labs (all labs ordered are listed, but only abnormal results are displayed) Labs Reviewed  COMPREHENSIVE METABOLIC PANEL - Abnormal; Notable for the following components:      Result Value   Glucose, Bld 116 (*)    BUN 21 (*)    All other components within normal limits  URINALYSIS, ROUTINE W REFLEX MICROSCOPIC - Abnormal; Notable for the following components:   Color, Urine STRAW (*)    All other components within normal limits  CBC  LIPASE, BLOOD  POC OCCULT BLOOD, ED  TYPE AND SCREEN  ABO/RH    EKG None  Radiology CT ABDOMEN PELVIS W CONTRAST  Result Date: 06/21/2023 CLINICAL DATA:  Left lower quadrant pain for 4 days. Rectal bleeding. EXAM: CT ABDOMEN AND PELVIS WITH CONTRAST TECHNIQUE:  Multidetector CT imaging of the abdomen and pelvis was performed using the standard protocol following bolus administration of intravenous contrast. RADIATION DOSE REDUCTION: This exam was performed according to the departmental dose-optimization program which includes automated exposure control, adjustment of the mA and/or kV according to patient size and/or use of iterative reconstruction technique. CONTRAST:  OMNIPAQUE IOHEXOL 300 MG/ML  SOLN COMPARISON:  None Available. FINDINGS: Lower Chest: No acute findings. Hepatobiliary: No suspicious hepatic masses identified. Gallbladder is unremarkable. No evidence of biliary ductal dilatation. Pancreas:  No mass or inflammatory changes. Spleen: Within normal limits in size and appearance. Adrenals/Urinary Tract: No suspicious masses identified. No evidence of ureteral calculi or hydronephrosis. Stomach/Bowel: No evidence of obstruction, inflammatory process or abnormal fluid collections. Normal appendix visualized. Vascular/Lymphatic: No pathologically enlarged lymph nodes. No acute vascular findings. Reproductive:  No mass or other significant abnormality. Other:  None. Musculoskeletal:  No suspicious bone lesions identified. IMPRESSION: Negative.  No acute findings or other significant abnormality. Electronically Signed   By: Danae Orleans M.D.   On: 06/21/2023 13:55    Procedures Procedures    Medications Ordered in ED Medications - No data to display  ED Course/ Medical Decision Making/ A&P    Patient seen and examined. History obtained directly from patient.  Reviewed previous surgery notes.  Labs/EKG: Ordered CBC, CMP, lipase, UA.  Imaging: Ordered CT abdomen pelvis  Medications/Fluids: None ordered   Most recent vital signs reviewed and are as follows: BP (!) 145/101 (BP Location: Left Arm)   Pulse 69   Temp 98.6 F (37 C) (Oral)   Resp 16   Ht 5\' 7"  (1.702 m)   Wt 81.6 kg   SpO2 97%   BMI 28.19 kg/m   Initial impression:  Left-sided abdominal pain with bleeding, no apparent hemorrhoidal bleeding.  External rectal exam performed with NT chaperone.  2:16 PM Reassessment performed. Patient appears stable, comfortable.  Labs personally reviewed and interpreted including: CBC unremarkable with normal hemoglobin; CMP mildly elevated glucose at 116 otherwise unremarkable; lipase normal; UA negative for infection.  Imaging personally visualized and interpreted including: CT abdomen pelvis, agree negative.  Reviewed pertinent lab work and imaging with patient at bedside. Questions answered.   Most current vital signs reviewed and are as follows: BP (!) 145/101 (BP Location: Left Arm)   Pulse 69   Temp 98.6 F (37 C) (Oral)   Resp 16   Ht 5\' 7"  (1.702 m)   Wt 81.6 kg   SpO2 97%   BMI 28.19 kg/m   Plan: Discharge to home.   Prescriptions written for: Bentyl  Other home care  instructions discussed: Increase fiber, bland diet  ED return instructions discussed: The patient was urged to return to the Emergency Department immediately with worsening of current symptoms, worsening abdominal pain, persistent vomiting, blood noted in stools, fever, or any other concerns. The patient verbalized understanding.   Follow-up instructions discussed: Patient encouraged to follow-up with their PCP in 7 days.                                 Medical Decision Making Amount and/or Complexity of Data Reviewed Labs: ordered. Radiology: ordered.  Risk Prescription drug management.   For this patient's complaint of abdominal pain, the following conditions were considered on the differential diagnosis: gastritis/PUD, enteritis/duodenitis, appendicitis, cholelithiasis/cholecystitis, cholangitis, pancreatitis, ruptured viscus, colitis, diverticulitis, small/large bowel obstruction, proctitis, cystitis, pyelonephritis, ureteral colic, aortic dissection, aortic aneurysm. Atypical chest etiologies were also considered including  ACS, PE, and pneumonia.  Labs and CT were reassuring today.   The patient's vital signs, pertinent lab work and imaging were reviewed and interpreted as discussed in the ED course. Hospitalization was considered for further testing, treatments, or serial exams/observation. However as patient is well-appearing, has a stable exam, and reassuring studies today, I do not feel that they warrant admission at this time. This plan was discussed with the patient who verbalizes agreement and comfort with this plan and seems reliable and able to return to the Emergency Department with worsening or changing symptoms.           Final Clinical Impression(s) / ED Diagnoses Final diagnoses:  Left sided abdominal pain  Hematochezia    Rx / DC Orders ED Discharge Orders          Ordered    dicyclomine (BENTYL) 20 MG tablet  2 times daily        06/21/23 1415              Renne Crigler, PA-C 06/21/23 1419    Wynetta Fines, MD 06/21/23 1526

## 2023-06-21 NOTE — ED Triage Notes (Signed)
Pt c/o intermittent L side abdominal pain and rectal bleeding w/ BMs x4 days.  Pain score 5/10.  Hx of internal hemorrhoids.  Pt has not taken anything for symptoms.

## 2023-06-21 NOTE — Discharge Instructions (Addendum)
Please read and follow all provided instructions.  Your diagnoses today include:  1. Left sided abdominal pain   2. Hematochezia     Tests performed today include: Blood cell counts and platelets Kidney and liver function tests Pancreas function test (called lipase) Urine test to look for infection A blood or urine test for pregnancy (women only) CT scan of the abdomen and pelvis was negative for infection or other abnormalities Vital signs. See below for your results today.   Medications prescribed:  Bentyl - medication for intestinal cramps and spasms  Take any prescribed medications only as directed.  Home care instructions:  Follow any educational materials contained in this packet.  Follow-up instructions: Please follow-up with your primary care provider in the next 7 days for further evaluation of your symptoms.    Return instructions:  SEEK IMMEDIATE MEDICAL ATTENTION IF: The pain does not go away or becomes severe  A temperature above 101F develops  Repeated vomiting occurs (multiple episodes)  The pain becomes localized to portions of the abdomen. The right side could possibly be appendicitis. In an adult, the left lower portion of the abdomen could be colitis or diverticulitis.  Blood is being passed in stools or vomit (bright red or black tarry stools)  You develop chest pain, difficulty breathing, dizziness or fainting, or become confused, poorly responsive, or inconsolable (young children) If you have any other emergent concerns regarding your health  Additional Information: Abdominal (belly) pain can be caused by many things. Your caregiver performed an examination and possibly ordered blood/urine tests and imaging (CT scan, x-rays, ultrasound). Many cases can be observed and treated at home after initial evaluation in the emergency department. Even though you are being discharged home, abdominal pain can be unpredictable. Therefore, you need a repeated exam if  your pain does not resolve, returns, or worsens. Most patients with abdominal pain don't have to be admitted to the hospital or have surgery, but serious problems like appendicitis and gallbladder attacks can start out as nonspecific pain. Many abdominal conditions cannot be diagnosed in one visit, so follow-up evaluations are very important.  Your vital signs today were: BP (!) 145/101 (BP Location: Left Arm)   Pulse 69   Temp 98.6 F (37 C) (Oral)   Resp 16   Ht 5\' 7"  (1.702 m)   Wt 81.6 kg   SpO2 97%   BMI 28.19 kg/m  If your blood pressure (bp) was elevated above 135/85 this visit, please have this repeated by your doctor within one month. --------------

## 2023-09-27 ENCOUNTER — Ambulatory Visit
Admission: EM | Admit: 2023-09-27 | Discharge: 2023-09-27 | Disposition: A | Discharge status: Home / Self Care | Payer: 59 | Attending: Family Medicine | Admitting: Family Medicine

## 2023-09-27 ENCOUNTER — Other Ambulatory Visit: Payer: Self-pay

## 2023-09-27 ENCOUNTER — Ambulatory Visit (INDEPENDENT_AMBULATORY_CARE_PROVIDER_SITE_OTHER): Payer: 59 | Admitting: Radiology

## 2023-09-27 DIAGNOSIS — J209 Acute bronchitis, unspecified: Secondary | ICD-10-CM

## 2023-09-27 DIAGNOSIS — R052 Subacute cough: Secondary | ICD-10-CM

## 2023-09-27 DIAGNOSIS — J040 Acute laryngitis: Secondary | ICD-10-CM | POA: Diagnosis not present

## 2023-09-27 MED ORDER — PROMETHAZINE-DM 6.25-15 MG/5ML PO SYRP: 5.0000 mL | ORAL_SOLUTION | Freq: Four times a day (QID) | ORAL | 0 refills | Status: AC | PRN

## 2023-09-27 MED ORDER — PREDNISONE 10 MG (21) PO TBPK: ORAL_TABLET | ORAL | 0 refills | Status: AC

## 2023-09-27 NOTE — Discharge Instructions (Signed)
The x-ray reading we discussed is preliminary. Your x-ray will be read by a radiologist in next few hours. If there is a discrepancy, you will be contacted, and instructed on a new plan for you care.

## 2023-09-27 NOTE — ED Triage Notes (Addendum)
 Pt presents with complaints of cough, congestion, and loss of voice x 1 month. Pt currently denies pain. Only OTC medication taken is Nyquil with no relief. Denies fevers at home. Pt's family members have also been sick.

## 2023-09-27 NOTE — ED Provider Notes (Signed)
 [facility]    CSN: [id number] Arrival date & time: 09/27/23  1413      History   Chief Complaint Chief Complaint  Patient presents with   Laryngitis   Cough    HPI Peter Ward is a 43 y.o. male.    Cough Has had a cough for 1 month occasional sputum, had more symptoms at onset of illness including sore throat which resolved, rhinorrhea and nasal congestion.  Has had hoarse voice days.  Denies difficulty swallowing, difficulty breathing, chest pain, shortness of breath, wheezing, nausea, vomiting, diarrhea.  Multiple family members have been sick but all of them improved within 1 week.  Denies history of asthma, bronchitis or pneumonia, reflux, allergies, no daily medications.  No known drug allergies  History reviewed. No pertinent past medical history.  There are no active problems to display for this patient.   History reviewed. No pertinent surgical history.     Home Medications    Prior to Admission medications   Medication Sig Start Date End Date Taking? Authorizing Provider  predniSONE (STERAPRED UNI-PAK 21 TAB) 10 MG (21) TBPK tablet Take as directed 09/27/23  Yes Meliton Rattan, PA  promethazine-dextromethorphan (PROMETHAZINE-DM) 6.25-15 MG/5ML syrup Take 5 mLs by mouth 4 (four) times daily as needed for up to 6 days for cough. 09/27/23 10/03/23 Yes Meliton Rattan, PA    Family History Family History  Problem Relation Age of Onset   Diabetes Mother     Social History Social History   Tobacco Use   Smoking status: Never   Smokeless tobacco: Never  Vaping Use   Vaping status: Never Used  Substance Use Topics   Alcohol use: No    Alcohol/week: 0.0 standard drinks of alcohol   Drug use: No     Allergies   Patient has no known allergies.   Review of Systems Review of Systems  Respiratory:  Positive for cough.      Physical Exam Triage Vital Signs ED Triage Vitals  Encounter Vitals Group     BP 09/27/23 1427 (!) 142/94      Systolic BP Percentile --      Diastolic BP Percentile --      Pulse Rate 09/27/23 1427 73     Resp 09/27/23 1427 16     Temp 09/27/23 1427 98 F (36.7 C)     Temp Source 09/27/23 1427 Oral     SpO2 09/27/23 1427 96 %     Weight 09/27/23 1427 175 lb (79.4 kg)     Height --      Head Circumference --      Peak Flow --      Pain Score 09/27/23 1443 0     Pain Loc --      Pain Education --      Exclude from Growth Chart --    No data found.  Updated Vital Signs BP (!) 142/94 (BP Location: Right Arm)   Pulse 73   Temp 98 F (36.7 C) (Oral)   Resp 16   Wt 175 lb (79.4 kg)   SpO2 96%   BMI 27.41 kg/m   Visual Acuity Right Eye Distance:   Left Eye Distance:   Bilateral Distance:    Right Eye Near:   Left Eye Near:    Bilateral Near:     Physical Exam Vitals and nursing note reviewed.  Constitutional:      Appearance: He is not ill-appearing.  HENT:     Head: Normocephalic  and atraumatic.     Right Ear: Tympanic membrane and ear canal normal.     Left Ear: Tympanic membrane and ear canal normal.     Nose: No rhinorrhea.     Mouth/Throat:     Mouth: Mucous membranes are moist.     Pharynx: Oropharynx is clear. Uvula midline. No oropharyngeal exudate, posterior oropharyngeal erythema, uvula swelling or postnasal drip.     Tonsils: No tonsillar exudate or tonsillar abscesses.     Comments: Hoarse voice, normal swallowing, no trismus Cardiovascular:     Rate and Rhythm: Normal rate and regular rhythm.     Heart sounds: Normal heart sounds.  Pulmonary:     Effort: Pulmonary effort is normal. No respiratory distress.     Breath sounds: Normal breath sounds. No wheezing, rhonchi or rales.  Musculoskeletal:     Cervical back: Neck supple.  Lymphadenopathy:     Cervical: No cervical adenopathy.  Neurological:     Mental Status: He is alert and oriented to person, place, and time.  Psychiatric:        Mood and Affect: Mood normal.      UC Treatments / Results   Labs (all labs ordered are listed, but only abnormal results are displayed) Labs Reviewed - No data to display  EKG   Radiology DG Chest 2 View Result Date: 09/27/2023 CLINICAL DATA:  Cough for 1 month. EXAM: CHEST - 2 VIEW COMPARISON:  02/01/2019 FINDINGS: The heart size and mediastinal contours are within normal limits. Both lungs are clear. The visualized skeletal structures are unremarkable. IMPRESSION: No active cardiopulmonary disease. Electronically Signed   By: Danae Orleans M.D.   On: 09/27/2023 15:38    Procedures Procedures (including critical care time)  Medications Ordered in UC Medications - No data to display  Initial Impression / Assessment and Plan / UC Course  I have reviewed the triage vital signs and the nursing notes.  Pertinent labs & imaging results that were available during my care of the patient were reviewed by me and considered in my medical decision making (see chart for details).     43 year old with cough for 1 month occasional sputum fever Well-appearing, vital signs are stable, he does have a hoarse voice, lungs are clear to auscultation will obtain chest x-ray Chest x-ray independently viewed by me NAD, Discussed with patient viral bronchitis and laryngitis will treat with steroid Dosepak and Promethazine DM instructed to follow-up with his PCP, ED for severe symptoms new symptoms or concerns Final Clinical Impres viral bronchitis and laryngitis will treat with short course of sions(s) / UC Diagnoses   Final diagnoses:  Subacute cough  Acute bronchitis, unspecified organism  Laryngitis     Discharge Instructions      The x-ray reading we discussed is preliminary. Your x-ray will be read by a radiologist in next few hours. If there is a discrepancy, you will be contacted, and instructed on a new plan for you care.     ED Prescriptions     Medication Sig Dispense Auth. Provider   predniSONE (STERAPRED UNI-PAK 21 TAB) 10 MG (21) TBPK  tablet Take as directed 21 tablet Meliton Rattan, PA   promethazine-dextromethorphan (PROMETHAZINE-DM) 6.25-15 MG/5ML syrup Take 5 mLs by mouth 4 (four) times daily as needed for up to 6 days for cough. 118 mL Meliton Rattan, [place]      PDMP not reviewed this encounter.   Meliton Rattan, [place] 09/27/23 [phone]
